# Patient Record
Sex: Female | Born: 2000
Health system: Southern US, Community
[De-identification: ages and names within clinical notes are randomized; demographics above are authoritative.]

---
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
## 2021-01-07 ENCOUNTER — Encounter: Payer: Self-pay | Admitting: Family Medicine

## 2021-01-07 NOTE — Progress Notes (Signed)
Patient did not keep appointment today. She may call to reschedule.
